# Patient Record
Sex: Female | Born: 1995 | Race: White | Hispanic: No | Marital: Single | Smoking: Never smoker
Health system: Southern US, Community
[De-identification: ages and names within clinical notes are randomized; demographics above are authoritative.]

## PROBLEM LIST (undated history)

## (undated) DIAGNOSIS — R51 Headache: Secondary | ICD-10-CM

## (undated) DIAGNOSIS — F329 Major depressive disorder, single episode, unspecified: Secondary | ICD-10-CM

## (undated) DIAGNOSIS — F32A Depression, unspecified: Secondary | ICD-10-CM

## (undated) HISTORY — DX: Depression, unspecified: F32.A

## (undated) HISTORY — DX: Major depressive disorder, single episode, unspecified: F32.9

## (undated) HISTORY — DX: Headache: R51

---
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
## 2013-04-07 ENCOUNTER — Ambulatory Visit: Payer: Self-pay | Admitting: Emergency Medicine

## 2013-07-01 ENCOUNTER — Telehealth: Payer: Self-pay | Admitting: Family

## 2013-07-01 MED ORDER — SULFAMETHOXAZOLE-TMP DS 800-160 MG PO TABS: 1.0000 | ORAL_TABLET | Freq: Two times a day (BID) | ORAL | Status: DC

## 2013-07-01 NOTE — Telephone Encounter (Signed)
Pt's mother is calling in regards to daughter, pt has a uti and is away in school and will not be home until tomorrow. Mother wants to know if an antibiotic can be called in for her, if possible [facility] to [facility] on [address].

## 2013-07-01 NOTE — Telephone Encounter (Signed)
Pt c/o burning with urination and dysuria   Bactrim bid x 7 days, [facility]

## 2015-02-02 ENCOUNTER — Ambulatory Visit (INDEPENDENT_AMBULATORY_CARE_PROVIDER_SITE_OTHER): Payer: Self-pay | Admitting: Family

## 2015-02-02 ENCOUNTER — Encounter: Payer: Self-pay | Admitting: Family

## 2015-02-02 DIAGNOSIS — S161XXA Strain of muscle, fascia and tendon at neck level, initial encounter: Secondary | ICD-10-CM

## 2015-02-02 DIAGNOSIS — R809 Proteinuria, unspecified: Secondary | ICD-10-CM

## 2015-02-02 DIAGNOSIS — M546 Pain in thoracic spine: Secondary | ICD-10-CM

## 2015-02-02 LAB — POCT URINALYSIS DIPSTICK
BILIRUBIN UA: NEGATIVE
Glucose, UA: NEGATIVE
Ketones, UA: NEGATIVE
NITRITE UA: NEGATIVE
PH UA: 6
Protein, UA: POSITIVE
RBC UA: NEGATIVE
Spec Grav, UA: 1.025
UROBILINOGEN UA: 0.2

## 2015-02-02 MED ORDER — CYCLOBENZAPRINE HCL 5 MG PO TABS: 5.0000 mg | ORAL_TABLET | Freq: Three times a day (TID) | ORAL | Status: AC | PRN

## 2015-02-02 NOTE — Patient Instructions (Signed)

## 2015-02-02 NOTE — Progress Notes (Signed)
Pre visit review using our clinic review tool, if applicable. No additional management support is needed unless otherwise documented below in the visit note. 
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
## 2015-02-04 LAB — URINE CULTURE
COLONY COUNT: NO GROWTH
Organism ID, Bacteria: NO GROWTH

## 2015-02-22 ENCOUNTER — Telehealth: Payer: Self-pay | Admitting: Family

## 2015-02-22 NOTE — Telephone Encounter (Signed)
Pt mom dropped off form on 02-13-15 and is calling back to check on status

## 2015-02-23 NOTE — Telephone Encounter (Signed)
This form was a request for Medical Records. Her information has not been completed just yet. Please let patient know once completed she will be contacted or forward to Margarett to follow-up with Medical Records. Thanks.

## 2015-02-23 NOTE — Telephone Encounter (Signed)
Pt mom no longer needs form to be completed
